# Patient Record
Sex: Female | Born: 1990 | Race: White | Hispanic: No | Marital: Single | Smoking: Former smoker
Health system: Southern US, Community
[De-identification: ages and names within clinical notes are randomized; demographics above are authoritative.]

## PROBLEM LIST (undated history)

## (undated) DIAGNOSIS — Z87898 Personal history of other specified conditions: Secondary | ICD-10-CM

## (undated) DIAGNOSIS — Z8659 Personal history of other mental and behavioral disorders: Secondary | ICD-10-CM

## (undated) HISTORY — DX: Personal history of other mental and behavioral disorders: Z86.59

## (undated) HISTORY — DX: Personal history of other specified conditions: Z87.898

---

## 2015-01-09 ENCOUNTER — Ambulatory Visit: Payer: Self-pay | Admitting: Family Medicine

## 2015-01-16 ENCOUNTER — Encounter: Payer: Self-pay | Admitting: Family Medicine

## 2015-01-16 ENCOUNTER — Ambulatory Visit (INDEPENDENT_AMBULATORY_CARE_PROVIDER_SITE_OTHER): Payer: Self-pay | Admitting: Family Medicine

## 2015-01-16 VITALS — BP 123/79 | HR 97 | Temp 99.0°F | Ht 62.0 in | Wt 108.0 lb

## 2015-01-16 DIAGNOSIS — T148XXA Other injury of unspecified body region, initial encounter: Secondary | ICD-10-CM

## 2015-01-16 DIAGNOSIS — S29012A Strain of muscle and tendon of back wall of thorax, initial encounter: Secondary | ICD-10-CM

## 2015-01-16 DIAGNOSIS — M546 Pain in thoracic spine: Secondary | ICD-10-CM

## 2015-01-16 MED ORDER — CYCLOBENZAPRINE HCL 10 MG PO TABS: 10.0000 mg | ORAL_TABLET | Freq: Three times a day (TID) | ORAL | Status: AC | PRN
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
## 2015-02-13 ENCOUNTER — Ambulatory Visit: Payer: Medicaid Other | Admitting: Family Medicine

## 2015-02-14 ENCOUNTER — Encounter: Payer: Self-pay | Admitting: Family Medicine

## 2015-08-27 ENCOUNTER — Ambulatory Visit: Payer: Medicaid Other | Admitting: Certified Nurse Midwife

## 2015-10-09 ENCOUNTER — Ambulatory Visit (INDEPENDENT_AMBULATORY_CARE_PROVIDER_SITE_OTHER): Payer: Medicaid Other | Admitting: Certified Nurse Midwife

## 2015-10-09 ENCOUNTER — Encounter: Payer: Self-pay | Admitting: Certified Nurse Midwife

## 2015-10-09 VITALS — BP 101/68 | HR 93 | Temp 97.8°F | Wt 133.6 lb

## 2015-10-09 DIAGNOSIS — N390 Urinary tract infection, site not specified: Secondary | ICD-10-CM

## 2015-10-09 DIAGNOSIS — Z0000 Encounter for general adult medical examination without abnormal findings: Secondary | ICD-10-CM

## 2015-10-09 DIAGNOSIS — Z01419 Encounter for gynecological examination (general) (routine) without abnormal findings: Secondary | ICD-10-CM

## 2015-10-09 DIAGNOSIS — Z113 Encounter for screening for infections with a predominantly sexual mode of transmission: Secondary | ICD-10-CM

## 2015-10-09 LAB — POCT URINALYSIS DIPSTICK
Bilirubin, UA: NEGATIVE
Blood, UA: 250
GLUCOSE UA: NEGATIVE
KETONES UA: NEGATIVE
LEUKOCYTES UA: NEGATIVE
Nitrite, UA: POSITIVE
Spec Grav, UA: 1.02
Urobilinogen, UA: NEGATIVE
pH, UA: 5

## 2015-10-09 MED ORDER — NITROFURANTOIN MONOHYD MACRO 100 MG PO CAPS: 100.0000 mg | ORAL_CAPSULE | Freq: Two times a day (BID) | ORAL | Status: AC

## 2015-10-09 MED ORDER — VITAFOL GUMMIES 3.33-0.333-34.8 MG PO CHEW: 3.0000 | CHEWABLE_TABLET | Freq: Every day | ORAL | Status: AC

## 2015-10-09 NOTE — Progress Notes (Signed)
Patient ID: Stacie Orozco, female   DOB: October 24, 1990, 25 y.o.   MRN: [id number]    Subjective:        Stacie Orozco is a 25 y.o. female here for a routine exam.  Current complaints:  Patient's mother present for exam.  [place]oved [place] in September 2016.  Nexplanon due to come out, September 2016.  Reports being sexually active.  Report condom use.  Requesting STD screening.  Mother reports patient being diagnosed with: Developmentally delayed with autistic characteristics, ADHD, small motor dysfunction.  Sees psychiatrist.  Seeking employment.  Graduated high school in 2013.  Would go months and months with out a cycle, when Nexplanon was first inserted.  Since "expiration" in September 2016, periods have been irregular.    Probably replacement of Nexplanon.   Reported family history: Breast CA: Breast CA. Uterine CA: MGM Colon CA: MGM  Personal health questionnaire:  Is patient Ashkenazi Jewish, have a family history of breast and/or ovarian cancer: no Is there a family history of uterine cancer diagnosed at age < 650, gastrointestinal cancer, urinary tract cancer, family member who is a Personnel officerLynch syndrome-associated carrier: no Is the patient overweight and hypertensive, family history of diabetes, personal history of gestational diabetes, preeclampsia or PCOS: no Is patient over 6955, have PCOS,  family history of premature CHD under age 23, diabetes, smoke, have hypertension or peripheral artery disease:  no At any time, has a partner hit, kicked or otherwise hurt or frightened you?: no Over the past 2 weeks, have you felt down, depressed or hopeless?: no Over the past 2 weeks, have you felt little interest or pleasure in doing things?:no   Gynecologic History No LMP recorded. Contraception: Nexplanon Last Pap: June 2016. Results were: normal Last mammogram: N/A. Results were: n/a  Obstetric History OB History  No data available    Past Medical History  Diagnosis Date   . History of ADHD   . History of developmental delay     developmentally delayed with autistic characteristics    History reviewed. No pertinent past surgical history.   Current outpatient prescriptions:  .  ARIPiprazole (ABILIFY) 2 MG tablet, Take 2 mg by mouth daily., Disp: , Rfl:  .  cyclobenzaprine (FLEXERIL) 10 MG tablet, Take 1 tablet (10 mg total) by mouth 3 (three) times daily as needed for muscle spasms., Disp: 90 tablet, Rfl: 0 .  nitrofurantoin, macrocrystal-monohydrate, (MACROBID) 100 MG capsule, Take 1 capsule (100 mg total) by mouth 2 (two) times daily., Disp: 14 capsule, Rfl: 0 .  Prenatal Vit-Fe Phos-FA-Omega (VITAFOL GUMMIES) 3.33-0.333-34.8 MG CHEW, Chew 3 tablets by mouth daily., Disp: 90 tablet, Rfl: 12 No Known Allergies  Social History  Substance Use Topics  . Smoking status: Former Smoker -- 1.00 packs/day for 3 years    Types: Cigarettes  . Smokeless tobacco: Never Used  . Alcohol Use: 1.2 oz/week    2 Cans of beer per week    Family History  Problem Relation Age of Onset  . Hypertension Mother   . Breast cancer Maternal Aunt   . Colon cancer Maternal Grandmother   . Uterine cancer Maternal Grandmother       Review of Systems  Constitutional: negative for fatigue.  Positive for weight gain of 20 lbs since starting Abilify. Respiratory: negative for cough and wheezing Cardiovascular: negative for chest pain, fatigue and palpitations Gastrointestinal: negative for abdominal pain and change in bowel habits Musculoskeletal:negative for myalgias Neurological: negative for gait problems and tremors Behavioral/Psych: negative  for abusive relationship, depression Endocrine: negative for temperature intolerance   Genitourinary:negative for abnormal menstrual periods, genital lesions, hot flashes, sexual problems and vaginal discharge Integument/breast: negative for breast lump, breast tenderness, nipple discharge and skin lesion(s)    Objective:       BP  101/68 mmHg  Pulse 93  Temp(Src) 97.8 F (36.6 C)  Wt 133 lb 9.6 oz (60.601 kg)  LMP  General:   alert  Skin:   no rash or abnormalities  Lungs:   clear to auscultation bilaterally  Heart:   regular rate and rhythm, S1, S2 normal, no murmur, click, rub or gallop  Breasts:   normal without suspicious masses, skin or nipple changes or axillary nodes  Abdomen:  normal findings: no organomegaly, soft, non-tender and no hernia  Pelvis:  External genitalia: normal general appearance Urinary system: urethral meatus normal and bladder without fullness, nontender Vaginal: normal without tenderness, induration or masses Cervix: normal appearance Adnexa: normal bimanual exam Uterus: anteverted and non-tender, normal size   Lab Review Urine pregnancy test Labs reviewed no Radiologic studies reviewed no  50% of 30 min visit spent on counseling and coordination of care.   Assessment:    Healthy female exam.    UTI Contraception counseling.   Plan:    Education reviewed: depression evaluation, low fat, low cholesterol diet, safe sex/STD prevention, self breast exams and weight bearing exercise. Contraception: Nexplanon. Schedule F/U for Nexplanon removal and new insertion.   Meds ordered this encounter  Medications  . ARIPiprazole (ABILIFY) 2 MG tablet    Sig: Take 2 mg by mouth daily.  . Prenatal Vit-Fe Phos-FA-Omega (VITAFOL GUMMIES) 3.33-0.333-34.8 MG CHEW    Sig: Chew 3 tablets by mouth daily.    Dispense:  90 tablet    Refill:  12  . nitrofurantoin, macrocrystal-monohydrate, (MACROBID) 100 MG capsule    Sig: Take 1 capsule (100 mg total) by mouth 2 (two) times daily.    Dispense:  14 capsule    Refill:  0   Orders Placed This Encounter  Procedures  . Urine culture  . Hepatitis B surface antigen  . RPR  . Hepatitis C antibody  . HIV antibody  . CBC with Differential/Platelet  . Comprehensive metabolic panel  . TSH  . POCT urinalysis dipstick   Need to obtain  previous records Possible management options include: nexplanon replacement,  Follow-up in 1 year for annual exam. F/U when ready for nexplanon removal and reinsertion Follow up as needed.

## 2015-10-10 LAB — CBC WITH DIFFERENTIAL/PLATELET
BASOS ABS: 0 10*3/uL (ref 0.0–0.2)
Basos: 0 %
EOS (ABSOLUTE): 0.3 10*3/uL (ref 0.0–0.4)
Eos: 3 %
HEMOGLOBIN: 14.4 g/dL (ref 11.1–15.9)
Hematocrit: 42.9 % (ref 34.0–46.6)
Immature Grans (Abs): 0 10*3/uL (ref 0.0–0.1)
Immature Granulocytes: 0 %
Lymphocytes Absolute: 1.9 10*3/uL (ref 0.7–3.1)
Lymphs: 16 %
MCH: 28.6 pg (ref 26.6–33.0)
MCHC: 33.6 g/dL (ref 31.5–35.7)
MCV: 85 fL (ref 79–97)
MONOS ABS: 0.9 10*3/uL (ref 0.1–0.9)
Monocytes: 7 %
NEUTROS PCT: 74 %
Neutrophils Absolute: 8.5 10*3/uL — ABNORMAL HIGH (ref 1.4–7.0)
Platelets: 332 10*3/uL (ref 150–379)
RBC: 5.04 x10E6/uL (ref 3.77–5.28)
RDW: 13.6 % (ref 12.3–15.4)
WBC: 11.6 10*3/uL — AB (ref 3.4–10.8)

## 2015-10-10 LAB — RPR: RPR: NONREACTIVE

## 2015-10-10 LAB — COMPREHENSIVE METABOLIC PANEL
ALBUMIN: 4.8 g/dL (ref 3.5–5.5)
ALT: 10 IU/L (ref 0–32)
AST: 20 IU/L (ref 0–40)
Albumin/Globulin Ratio: 1.4 (ref 1.2–2.2)
Alkaline Phosphatase: 77 IU/L (ref 39–117)
BUN / CREAT RATIO: 17 (ref 9–23)
BUN: 13 mg/dL (ref 6–20)
Bilirubin Total: 0.5 mg/dL (ref 0.0–1.2)
CALCIUM: 9.9 mg/dL (ref 8.7–10.2)
CO2: 23 mmol/L (ref 18–29)
CREATININE: 0.76 mg/dL (ref 0.57–1.00)
Chloride: 99 mmol/L (ref 96–106)
GFR, EST AFRICAN AMERICAN: 128 mL/min/{1.73_m2} (ref >59)
GFR, EST NON AFRICAN AMERICAN: 111 mL/min/{1.73_m2} (ref >59)
GLUCOSE: 87 mg/dL (ref 65–99)
Globulin, Total: 3.4 g/dL (ref 1.5–4.5)
Potassium: 4.3 mmol/L (ref 3.5–5.2)
Sodium: 140 mmol/L (ref 134–144)
TOTAL PROTEIN: 8.2 g/dL (ref 6.0–8.5)

## 2015-10-10 LAB — PAP IG W/ RFLX HPV ASCU: PAP Smear Comment: 0

## 2015-10-10 LAB — HEPATITIS C ANTIBODY

## 2015-10-10 LAB — HIV ANTIBODY (ROUTINE TESTING W REFLEX): HIV SCREEN 4TH GENERATION: NONREACTIVE

## 2015-10-10 LAB — HEPATITIS B SURFACE ANTIGEN: HEP B S AG: NEGATIVE

## 2015-10-10 LAB — TSH: TSH: 1.42 u[IU]/mL (ref 0.450–4.500)

## 2015-10-11 LAB — URINE CULTURE

## 2015-10-13 LAB — NUSWAB VG+, CANDIDA 6SP
ATOPOBIUM VAGINAE: HIGH {score} — AB
BVAB 2: HIGH {score} — AB
CANDIDA KRUSEI, NAA: NEGATIVE
Candida albicans, NAA: POSITIVE — AB
Candida glabrata, NAA: NEGATIVE
Candida lusitaniae, NAA: NEGATIVE
Candida parapsilosis, NAA: NEGATIVE
Candida tropicalis, NAA: NEGATIVE
Chlamydia trachomatis, NAA: NEGATIVE
MEGASPHAERA 1: HIGH {score} — AB
NEISSERIA GONORRHOEAE, NAA: NEGATIVE
TRICH VAG BY NAA: POSITIVE — AB

## 2015-10-14 ENCOUNTER — Other Ambulatory Visit: Payer: Self-pay | Admitting: Certified Nurse Midwife

## 2015-10-14 DIAGNOSIS — B373 Candidiasis of vulva and vagina: Secondary | ICD-10-CM

## 2015-10-14 DIAGNOSIS — B3731 Acute candidiasis of vulva and vagina: Secondary | ICD-10-CM

## 2015-10-14 DIAGNOSIS — A599 Trichomoniasis, unspecified: Secondary | ICD-10-CM

## 2015-10-14 MED ORDER — TERCONAZOLE 0.8 % VA CREA: 1.0000 | TOPICAL_CREAM | Freq: Every day | VAGINAL | Status: AC

## 2015-10-14 MED ORDER — TINIDAZOLE 500 MG PO TABS: 2.0000 g | ORAL_TABLET | Freq: Every day | ORAL | Status: AC

## 2015-10-14 MED ORDER — FLUCONAZOLE 200 MG PO TABS: 200.0000 mg | ORAL_TABLET | Freq: Once | ORAL | Status: AC

## 2015-10-15 ENCOUNTER — Telehealth: Payer: Self-pay | Admitting: *Deleted

## 2015-10-16 NOTE — Telephone Encounter (Signed)
See telephone note for this encounter.

## 2015-10-21 ENCOUNTER — Ambulatory Visit (INDEPENDENT_AMBULATORY_CARE_PROVIDER_SITE_OTHER): Payer: Medicaid Other | Admitting: Certified Nurse Midwife

## 2015-10-21 VITALS — BP 115/79 | HR 80 | Temp 98.5°F | Wt 134.0 lb

## 2015-10-21 DIAGNOSIS — Z01818 Encounter for other preprocedural examination: Secondary | ICD-10-CM

## 2015-10-21 DIAGNOSIS — Z30017 Encounter for initial prescription of implantable subdermal contraceptive: Secondary | ICD-10-CM

## 2015-10-21 DIAGNOSIS — Z3202 Encounter for pregnancy test, result negative: Secondary | ICD-10-CM

## 2015-10-21 DIAGNOSIS — Z3049 Encounter for surveillance of other contraceptives: Secondary | ICD-10-CM

## 2015-10-21 LAB — POCT URINE PREGNANCY: PREG TEST UR: NEGATIVE

## 2015-10-21 NOTE — Progress Notes (Signed)
Patient ID: Stacie Orozco, female   DOB: 1990/08/10, 25 y.o.   MRN: [id number]  Nexplanon Procedure Note    PROCEDURE: Nexplanon removal and placement Performing Provider: Orvilla Cornwallachelle Helaina Stefano CNM   Patient education prior to procedure, explained risk, benefits of Nexplanon, reviewed alternative options. Patient reported understanding. Gave consent to continue with procedure.  Patient had Nexplanon inserted in 2013. Desires removal today w/ reinsertion  PROCEDURE:  Pregnancy Text :  not indicated Site (check):      left arm         Sterile Preparation:   Betadinex3 Lot # [id number] Expiration Date 11/2017    The patient's left arm was palpated and the implant device located. The area was prepped with Betadinex3. The distal end of the device was palpated and 1.5 cc of 1% lidocaine without epinephrine was injected. A 1.5 mm incision was made. Any fibrotic tissue was carefully dissected away using blunt and/or sharp dissection. The device was removed in an intact manner.   Insertion site was the same as the removal site. Nexplanon  was inserted subcutaneously.Needle was removed from the insertion site. Nexplanon capsule was palpated by provider and patient to assure satisfactory placement. Dressing applied.  Followup: The patient tolerated the procedure well without complications.  Standard post-procedure care is explained and return precautions are given.  Orvilla Cornwallachelle Anguel Delapena, CNM

## 2017-05-09 ENCOUNTER — Emergency Department (HOSPITAL_COMMUNITY): Payer: Medicaid Other

## 2017-05-09 ENCOUNTER — Emergency Department (HOSPITAL_COMMUNITY)
Admission: EM | Admit: 2017-05-09 | Discharge: 2017-05-09 | Disposition: A | Discharge status: Home / Self Care | Payer: Medicaid Other | Attending: Emergency Medicine | Admitting: Emergency Medicine

## 2017-05-09 ENCOUNTER — Other Ambulatory Visit: Payer: Self-pay

## 2017-05-09 ENCOUNTER — Encounter (HOSPITAL_COMMUNITY): Payer: Self-pay | Admitting: Emergency Medicine

## 2017-05-09 DIAGNOSIS — M79605 Pain in left leg: Secondary | ICD-10-CM | POA: Insufficient documentation

## 2017-05-09 DIAGNOSIS — Z5321 Procedure and treatment not carried out due to patient leaving prior to being seen by health care provider: Secondary | ICD-10-CM | POA: Insufficient documentation

## 2017-05-09 NOTE — ED Notes (Signed)
Called for room placement, pt is no longer in waiting area.

## 2017-05-09 NOTE — ED Triage Notes (Signed)
Pt reports was dancing and reports left leg pain and "pop" x4 weeks ago. Pt reports continued pain and mild swelling to LLE. Pt able to bear weight but reports with pain.

## 2018-06-12 IMAGING — US US EXTREM LOW VENOUS*L*
1 series · 13 of 24 positions shown · non-contrast
Comparison: None.

CLINICAL DATA: Left leg pain and swelling for several weeks



[Series 1: us extrem low venous*left* · 0.07mm/px · 13 of 35 slices shown]
[im 1/35]
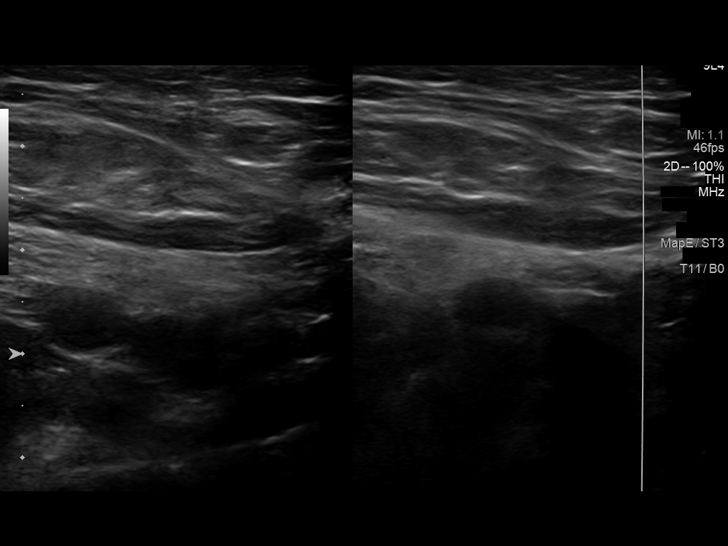
[im 3/35]
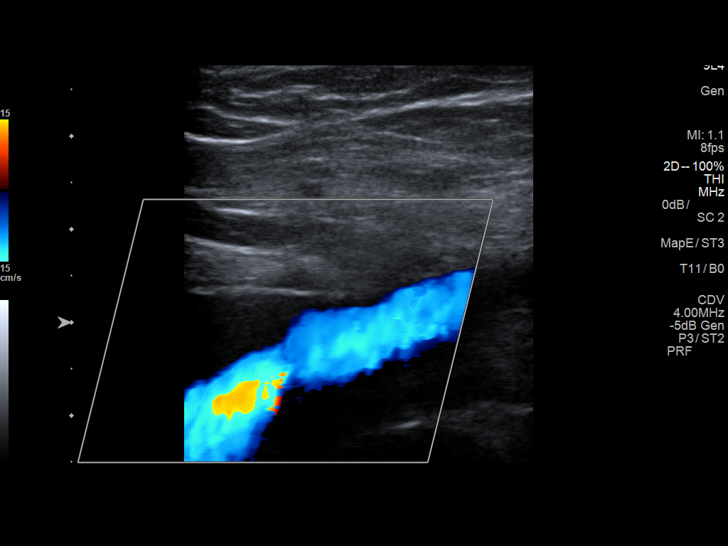
[im 6/35]
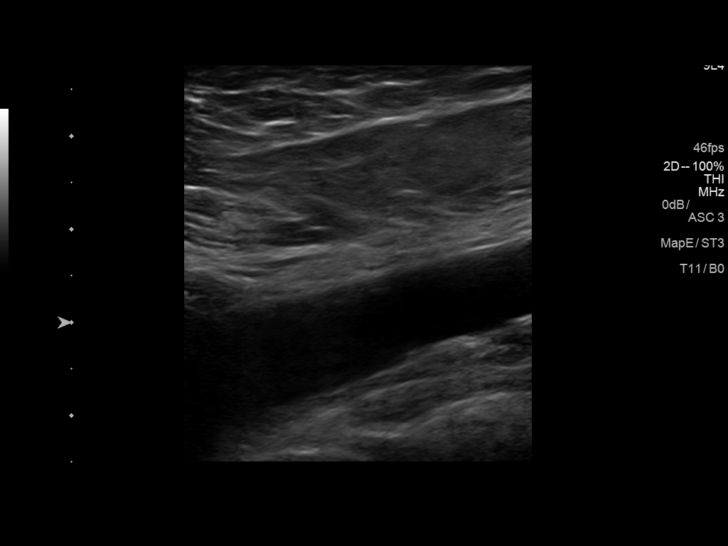
[im 9/35]
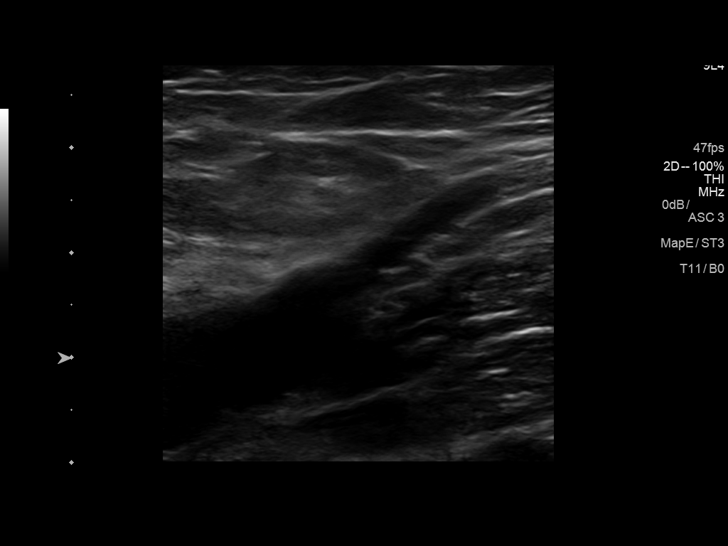
[im 12/35]
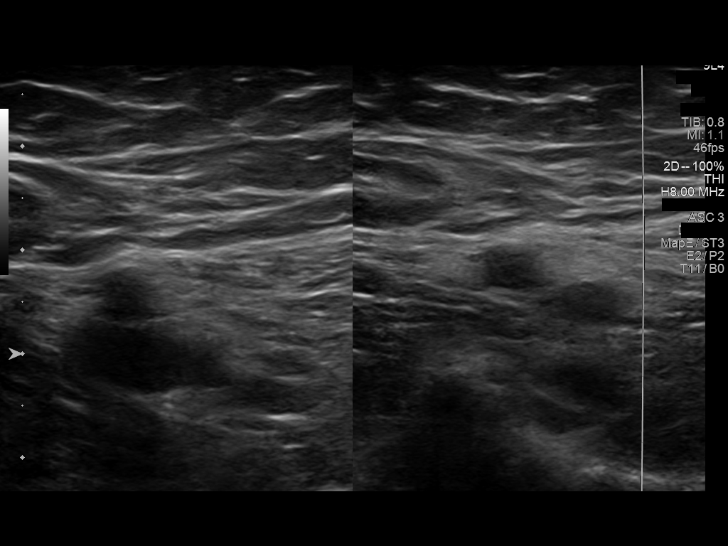
[im 15/35]
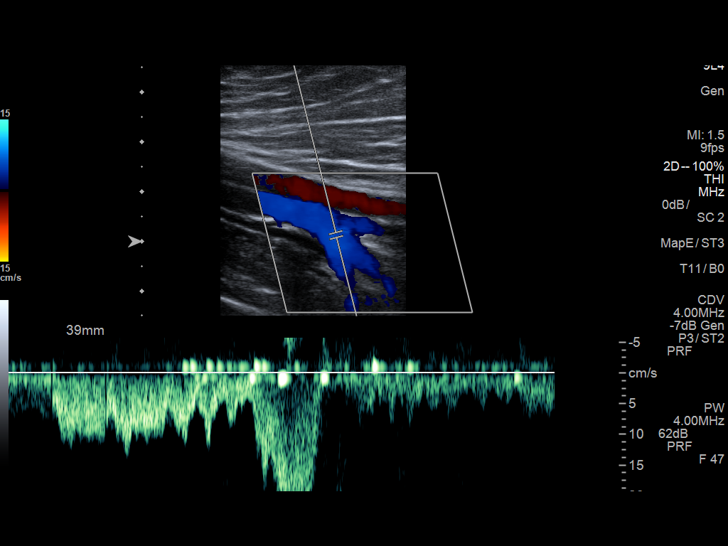
[im 18/35]
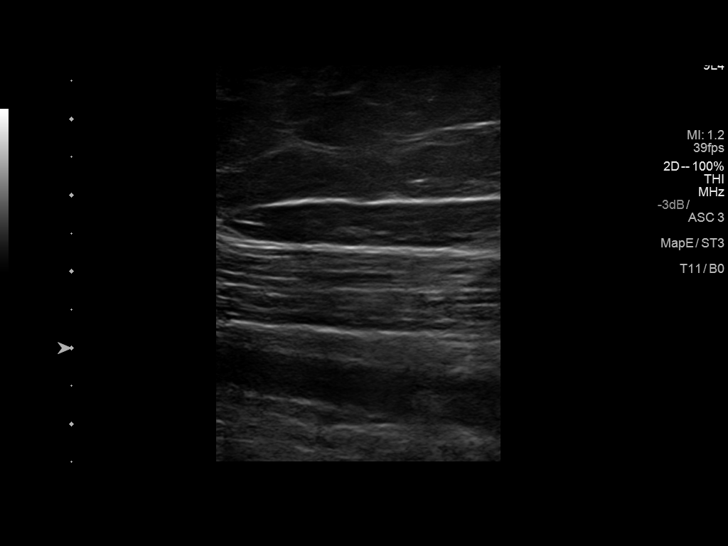
[im 20/35]
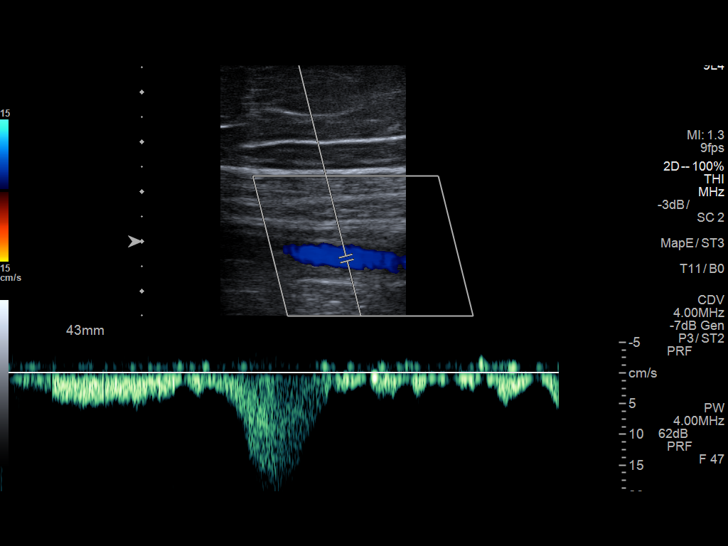
[im 23/35]
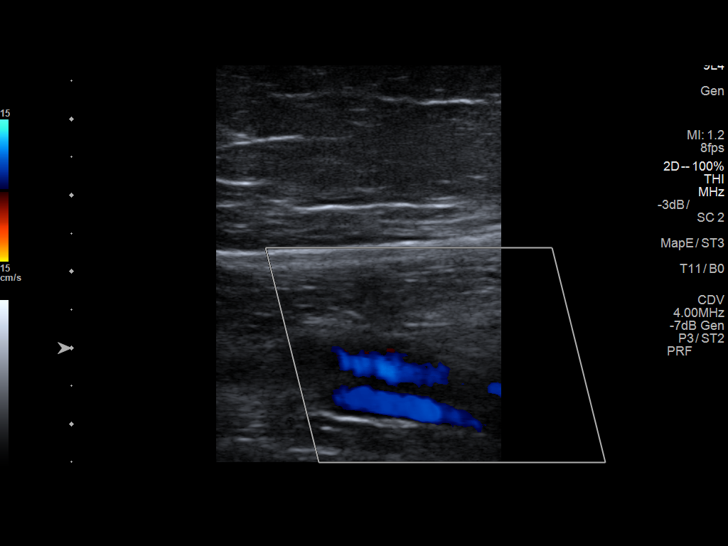
[im 26/35]
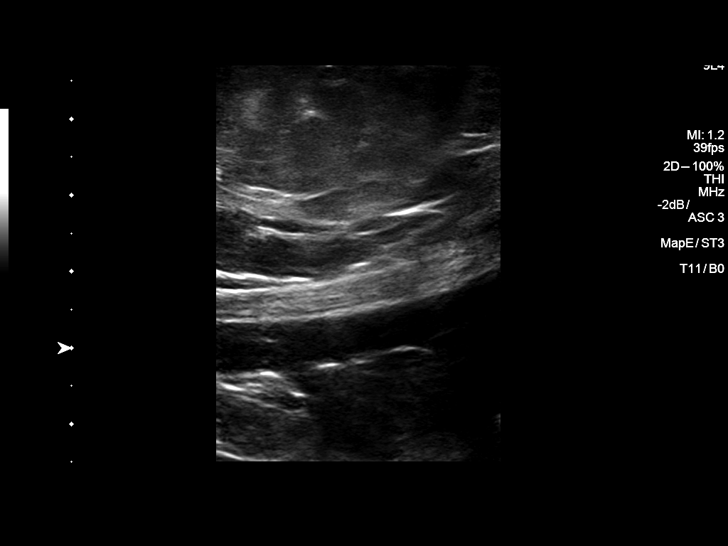
[im 29/35]
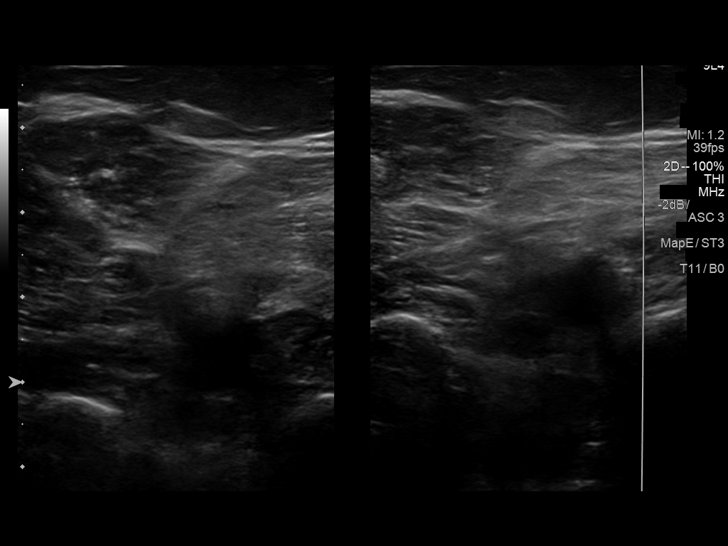
[im 32/35]
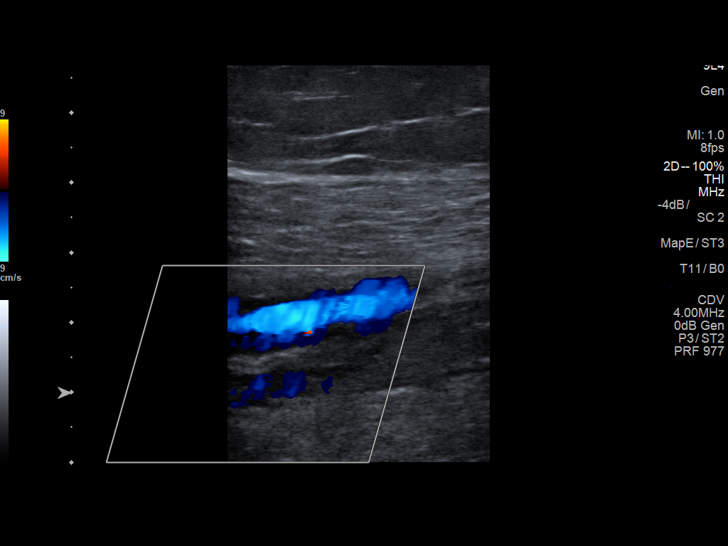
[im 35/35]
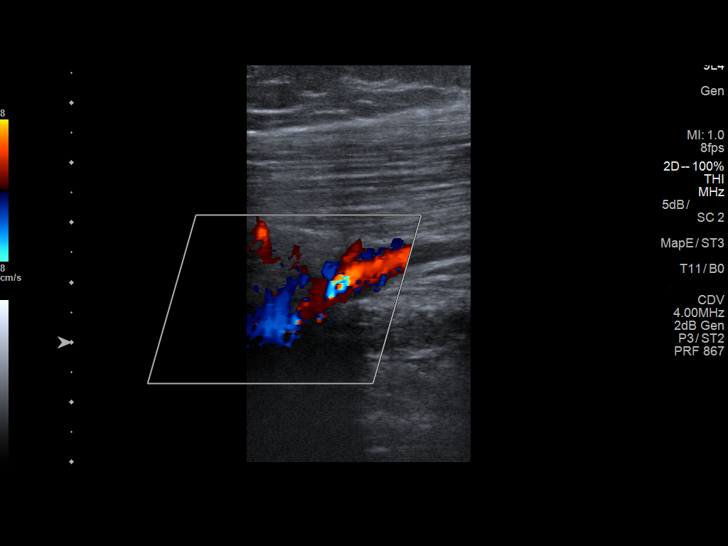

[13 of 24 positions shown; findings below may reference images not displayed]

FINDINGS: Contralateral Common Femoral Vein: Respiratory phasicity is normal
and symmetric with the symptomatic side. No evidence of thrombus.
Normal compressibility.

Common Femoral Vein: No evidence of thrombus. Normal
compressibility, respiratory phasicity and response to augmentation.

Saphenofemoral Junction: No evidence of thrombus. Normal
compressibility and flow on color Doppler imaging.

Profunda Femoral Vein: No evidence of thrombus. Normal
compressibility and flow on color Doppler imaging.

Femoral Vein: No evidence of thrombus. Normal compressibility,
respiratory phasicity and response to augmentation.

Popliteal Vein: No evidence of thrombus. Normal compressibility,
respiratory phasicity and response to augmentation.

Calf Veins: No evidence of thrombus. Normal compressibility and flow
on color Doppler imaging.

Superficial Great Saphenous Vein: No evidence of thrombus. Normal
compressibility.

Venous Reflux:  None.

Other Findings:  None.
IMPRESSION: No evidence of deep venous thrombosis.

## 2019-01-02 ENCOUNTER — Ambulatory Visit: Payer: Medicaid Other | Admitting: Advanced Practice Midwife

## 2022-09-06 DIAGNOSIS — F419 Anxiety disorder, unspecified: Secondary | ICD-10-CM | POA: Diagnosis not present

## 2022-09-06 DIAGNOSIS — F79 Unspecified intellectual disabilities: Secondary | ICD-10-CM | POA: Diagnosis not present

## 2022-09-07 DIAGNOSIS — F419 Anxiety disorder, unspecified: Secondary | ICD-10-CM | POA: Diagnosis not present

## 2022-09-07 DIAGNOSIS — F79 Unspecified intellectual disabilities: Secondary | ICD-10-CM | POA: Diagnosis not present

## 2022-09-14 DIAGNOSIS — F79 Unspecified intellectual disabilities: Secondary | ICD-10-CM | POA: Diagnosis not present

## 2022-09-14 DIAGNOSIS — F419 Anxiety disorder, unspecified: Secondary | ICD-10-CM | POA: Diagnosis not present

## 2022-09-22 DIAGNOSIS — F419 Anxiety disorder, unspecified: Secondary | ICD-10-CM | POA: Diagnosis not present

## 2022-09-22 DIAGNOSIS — F79 Unspecified intellectual disabilities: Secondary | ICD-10-CM | POA: Diagnosis not present

## 2022-09-28 DIAGNOSIS — F79 Unspecified intellectual disabilities: Secondary | ICD-10-CM | POA: Diagnosis not present

## 2022-09-28 DIAGNOSIS — F419 Anxiety disorder, unspecified: Secondary | ICD-10-CM | POA: Diagnosis not present

## 2022-09-29 DIAGNOSIS — F840 Autistic disorder: Secondary | ICD-10-CM | POA: Diagnosis not present

## 2022-09-29 DIAGNOSIS — F340 Cyclothymic disorder: Secondary | ICD-10-CM | POA: Diagnosis not present

## 2022-09-30 DIAGNOSIS — F419 Anxiety disorder, unspecified: Secondary | ICD-10-CM | POA: Diagnosis not present

## 2022-09-30 DIAGNOSIS — F79 Unspecified intellectual disabilities: Secondary | ICD-10-CM | POA: Diagnosis not present

## 2022-10-11 DIAGNOSIS — F79 Unspecified intellectual disabilities: Secondary | ICD-10-CM | POA: Diagnosis not present

## 2022-10-11 DIAGNOSIS — F419 Anxiety disorder, unspecified: Secondary | ICD-10-CM | POA: Diagnosis not present

## 2022-10-19 DIAGNOSIS — F419 Anxiety disorder, unspecified: Secondary | ICD-10-CM | POA: Diagnosis not present

## 2022-10-19 DIAGNOSIS — F79 Unspecified intellectual disabilities: Secondary | ICD-10-CM | POA: Diagnosis not present

## 2022-10-25 DIAGNOSIS — F419 Anxiety disorder, unspecified: Secondary | ICD-10-CM | POA: Diagnosis not present

## 2022-10-25 DIAGNOSIS — F79 Unspecified intellectual disabilities: Secondary | ICD-10-CM | POA: Diagnosis not present

## 2022-11-01 DIAGNOSIS — F419 Anxiety disorder, unspecified: Secondary | ICD-10-CM | POA: Diagnosis not present

## 2022-11-01 DIAGNOSIS — F79 Unspecified intellectual disabilities: Secondary | ICD-10-CM | POA: Diagnosis not present
